# Patient Record
Sex: Male | Born: 1935 | Race: Black or African American | Hispanic: No | Marital: Married
Health system: Southern US, Community
[De-identification: ages and names within clinical notes are randomized; demographics above are authoritative.]

---

## 2005-04-14 ENCOUNTER — Inpatient Hospital Stay: Payer: Self-pay | Admitting: Internal Medicine

## 2005-04-14 ENCOUNTER — Other Ambulatory Visit: Payer: Self-pay

## 2005-04-15 ENCOUNTER — Other Ambulatory Visit: Payer: Self-pay

## 2005-09-15 ENCOUNTER — Inpatient Hospital Stay: Payer: Self-pay | Admitting: Internal Medicine

## 2005-09-15 ENCOUNTER — Other Ambulatory Visit: Payer: Self-pay

## 2008-12-01 ENCOUNTER — Ambulatory Visit: Payer: Self-pay | Admitting: Neurology

## 2011-05-13 ENCOUNTER — Inpatient Hospital Stay: Payer: Self-pay | Admitting: Student

## 2011-07-16 ENCOUNTER — Emergency Department: Payer: Self-pay | Admitting: Unknown Physician Specialty

## 2011-10-11 ENCOUNTER — Ambulatory Visit: Payer: Self-pay | Admitting: Specialist

## 2011-10-11 ENCOUNTER — Inpatient Hospital Stay: Payer: Self-pay | Admitting: Internal Medicine

## 2011-10-11 LAB — CK TOTAL AND CKMB (NOT AT ARMC)
CK, Total: 36 U/L (ref 35–232)
CK, Total: 24 U/L — ABNORMAL LOW
CK-MB: 1 ng/mL

## 2011-10-11 LAB — COMPREHENSIVE METABOLIC PANEL
BUN: 17 mg/dL (ref 7–18)
Chloride: 101 mmol/L (ref 98–107)
Co2: 32 mmol/L (ref 21–32)
Creatinine: 1.19 mg/dL (ref 0.60–1.30)
EGFR (African American): >60
EGFR (Non-African Amer.): >60
Glucose: 103 mg/dL — ABNORMAL HIGH (ref 65–99)
Potassium: 3.5 mmol/L (ref 3.5–5.1)
SGOT(AST): 12 U/L — ABNORMAL LOW (ref 15–37)
Sodium: 143 mmol/L (ref 136–145)

## 2011-10-11 LAB — URINALYSIS, COMPLETE
Bacteria: NONE SEEN
Bilirubin,UR: NEGATIVE
Glucose,UR: NEGATIVE mg/dL (ref 0–75)
Hyaline Cast: 1
Ketone: NEGATIVE
Nitrite: NEGATIVE
RBC,UR: NONE SEEN /HPF (ref 0–5)
Specific Gravity: 1.013 (ref 1.003–1.030)
Squamous Epithelial: NONE SEEN
WBC UR: <1 /HPF (ref 0–5)

## 2011-10-11 LAB — PROTIME-INR
INR: 3.2
Prothrombin Time: 33.1 secs — ABNORMAL HIGH (ref 11.5–14.7)

## 2011-10-11 LAB — APTT: Activated PTT: 55.4 secs — ABNORMAL HIGH (ref 23.6–35.9)

## 2011-10-11 LAB — TROPONIN I
Troponin-I: 0.06 ng/mL — ABNORMAL HIGH
Troponin-I: 0.07 ng/mL — ABNORMAL HIGH

## 2011-10-11 LAB — CBC WITH DIFFERENTIAL/PLATELET
Basophil %: 0.6 %
Eosinophil %: 1.3 %
HCT: 33.7 % — ABNORMAL LOW (ref 40.0–52.0)
HGB: 10.8 g/dL — ABNORMAL LOW (ref 13.0–18.0)
Lymphocyte %: 19.7 %
MCH: 26.7 pg (ref 26.0–34.0)
MCV: 83 fL (ref 80–100)
Monocyte #: 0.9 10*3/uL — ABNORMAL HIGH (ref 0.0–0.7)
Neutrophil #: 4.9 10*3/uL (ref 1.4–6.5)
Neutrophil %: 65.7 %
RBC: 4.05 10*6/uL — ABNORMAL LOW (ref 4.40–5.90)
RDW: 17.4 % — ABNORMAL HIGH (ref 11.5–14.5)

## 2011-10-11 LAB — MAGNESIUM: Magnesium: 1.5 mg/dL — ABNORMAL LOW

## 2011-10-12 LAB — CK TOTAL AND CKMB (NOT AT ARMC): CK, Total: 22 U/L — ABNORMAL LOW (ref 35–232)

## 2011-10-13 LAB — CBC WITH DIFFERENTIAL/PLATELET
Basophil #: 0.1 10*3/uL (ref 0.0–0.1)
HCT: 32.1 % — ABNORMAL LOW (ref 40.0–52.0)
Lymphocyte #: 1.7 10*3/uL (ref 1.0–3.6)
Monocyte #: 1 10*3/uL — ABNORMAL HIGH (ref 0.0–0.7)
Monocyte %: 12.3 %
Neutrophil #: 5.3 10*3/uL (ref 1.4–6.5)
Neutrophil %: 65 %
Platelet: 299 10*3/uL (ref 150–440)
RBC: 3.91 10*6/uL — ABNORMAL LOW (ref 4.40–5.90)
RDW: 17.5 % — ABNORMAL HIGH (ref 11.5–14.5)
WBC: 8.1 10*3/uL (ref 3.8–10.6)

## 2011-10-13 LAB — PROTIME-INR
INR: 1.9
Prothrombin Time: 21.7 secs — ABNORMAL HIGH (ref 11.5–14.7)

## 2011-10-14 LAB — CBC WITH DIFFERENTIAL/PLATELET
Basophil #: 0.1 10*3/uL (ref 0.0–0.1)
Basophil %: 0.6 %
Eosinophil #: 0.2 10*3/uL (ref 0.0–0.7)
Eosinophil %: 1.9 %
HCT: 34.6 % — ABNORMAL LOW (ref 40.0–52.0)
HGB: 10.9 g/dL — ABNORMAL LOW (ref 13.0–18.0)
Lymphocyte #: 2 10*3/uL (ref 1.0–3.6)
MCHC: 31.5 g/dL — ABNORMAL LOW (ref 32.0–36.0)
MCV: 84 fL (ref 80–100)
Monocyte %: 11.5 %
Neutrophil #: 5.6 10*3/uL (ref 1.4–6.5)
Neutrophil %: 63.3 %
RBC: 4.13 10*6/uL — ABNORMAL LOW (ref 4.40–5.90)
RDW: 17.2 % — ABNORMAL HIGH (ref 11.5–14.5)

## 2011-10-14 LAB — BASIC METABOLIC PANEL
Anion Gap: 9 (ref 7–16)
BUN: 18 mg/dL (ref 7–18)
Calcium, Total: 8.8 mg/dL (ref 8.5–10.1)
Co2: 29 mmol/L (ref 21–32)
Creatinine: 1.05 mg/dL (ref 0.60–1.30)
EGFR (Non-African Amer.): >60
Potassium: 4.4 mmol/L (ref 3.5–5.1)
Sodium: 142 mmol/L (ref 136–145)

## 2011-10-14 LAB — PROTIME-INR
INR: 1.5
Prothrombin Time: 18.5 secs — ABNORMAL HIGH (ref 11.5–14.7)

## 2011-10-14 LAB — BODY FLUID CELL COUNT WITH DIFFERENTIAL
Basophil: 0 %
Eosinophil: 0 %

## 2011-10-14 LAB — GLUCOSE, SEROUS FLUID

## 2011-10-14 LAB — LACTATE DEHYDROGENASE, PLEURAL OR PERITONEAL FLUID: LDH, Body Fluid: 62 U/L

## 2011-10-14 LAB — APTT: Activated PTT: 81.5 secs — ABNORMAL HIGH (ref 23.6–35.9)

## 2011-10-15 LAB — APTT: Activated PTT: 84.2 secs — ABNORMAL HIGH (ref 23.6–35.9)

## 2011-12-18 DEATH — deceased

## 2013-03-16 IMAGING — US US EXTREM LOW VENOUS BILAT
1 series · 17 of 24 positions shown · non-contrast
Comparison: none

REASON FOR EXAM: swelling, pain
COMMENTS:

PROCEDURE:     US  - US DOPPLER LOW EXTR BILATERAL  - October 11, 2011  [DATE]
RESULT:     Comparison: None

[Series 1: us extrem low venous bilat · 17 of 43 slices shown]
[im 1/43]
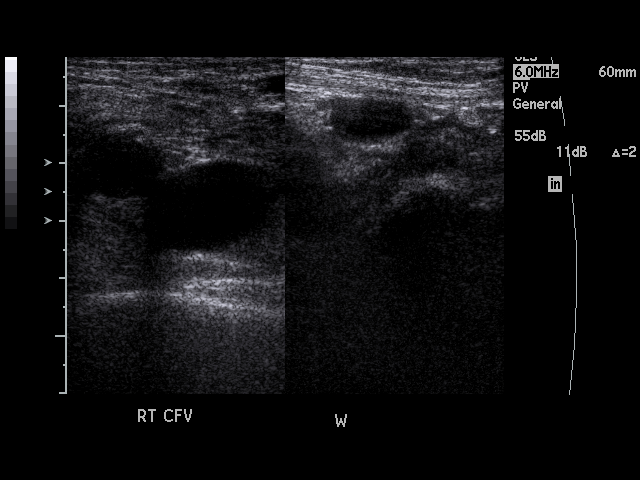
[im 4/43]
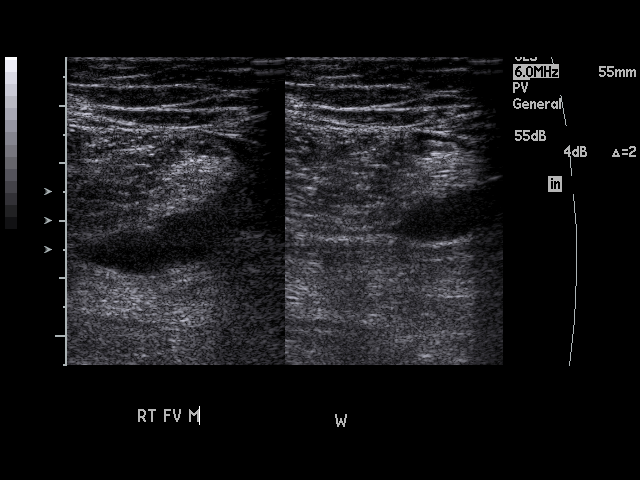
[im 6/43]
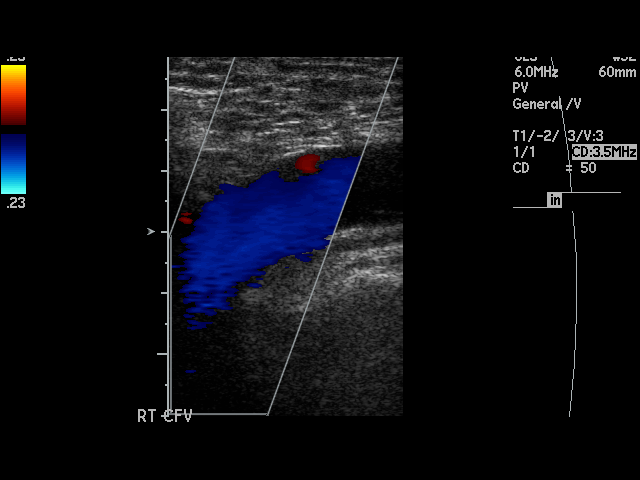
[im 8/43]
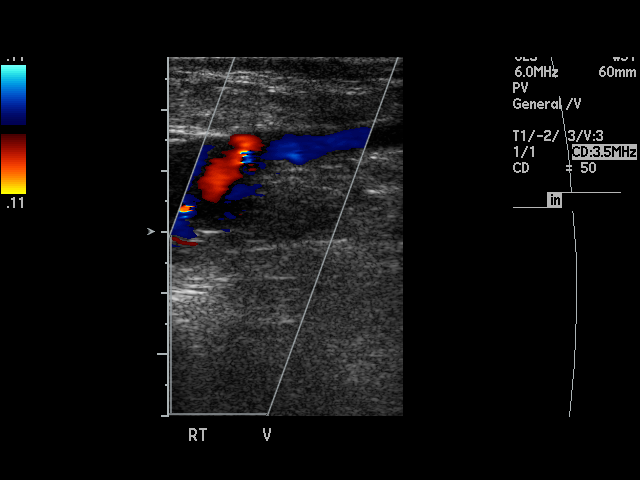
[im 11/43]
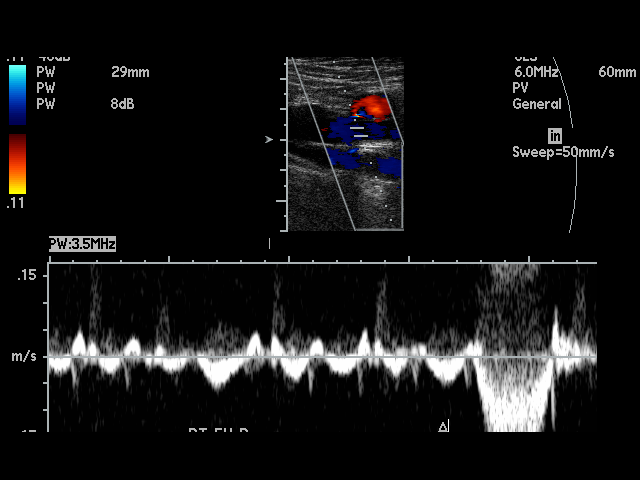
[im 13/43]
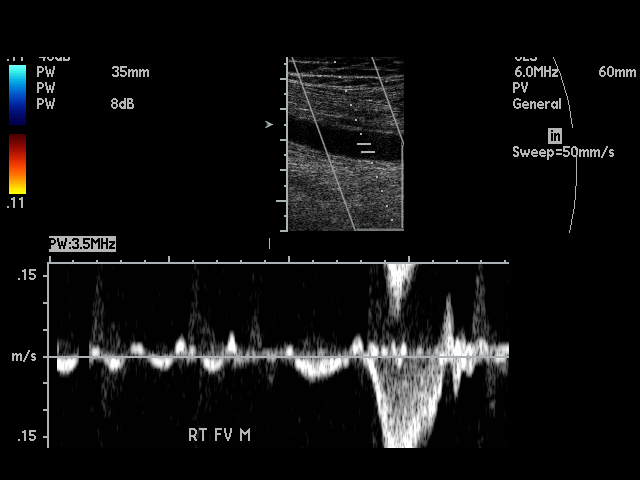
[im 17/43]
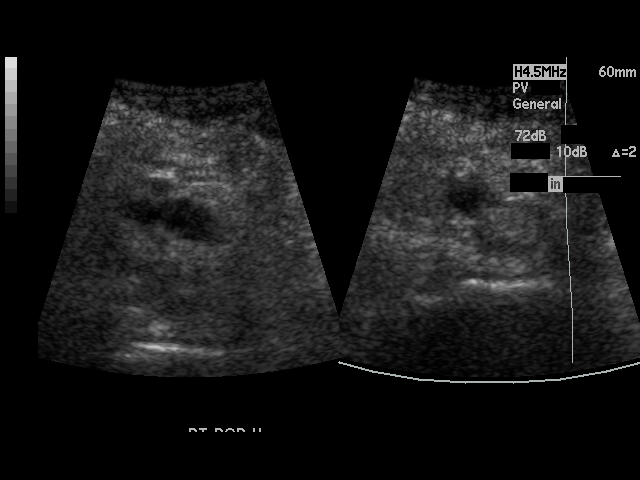
[im 19/43]
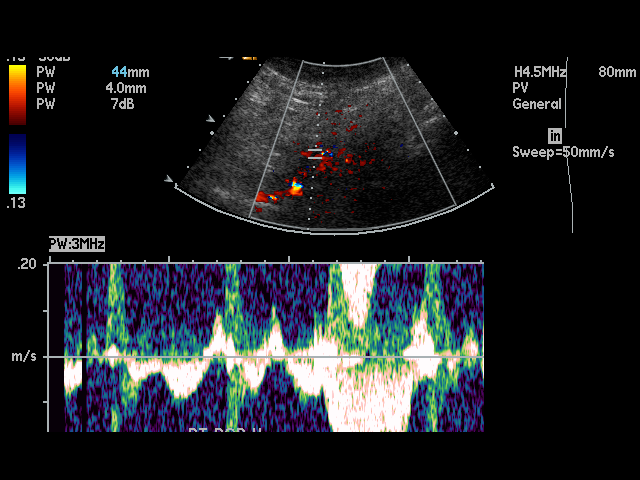
[im 22/43]
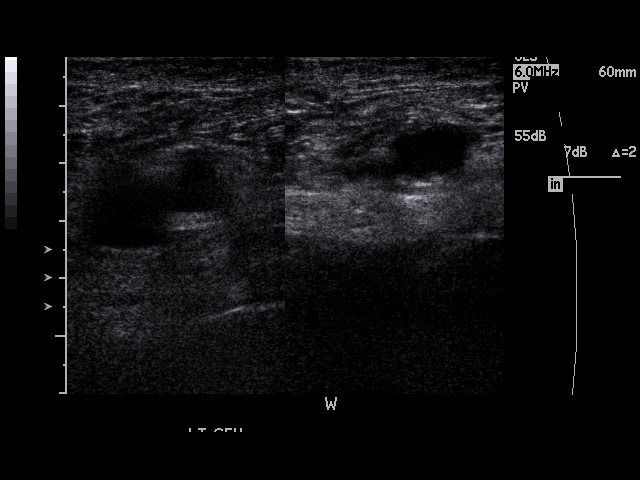
[im 24/43]
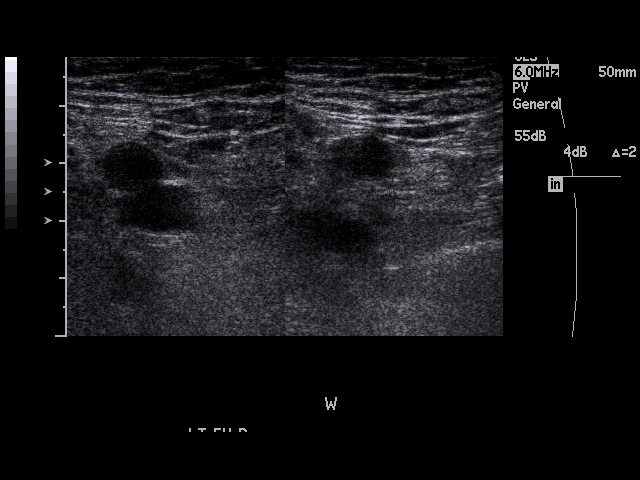
[im 26/43]
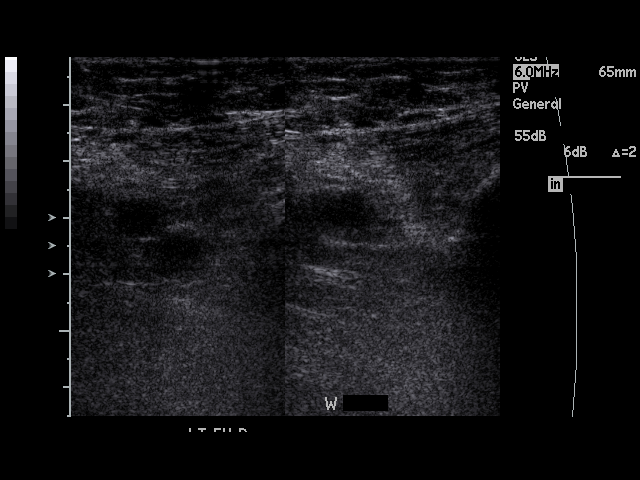
[im 30/43]
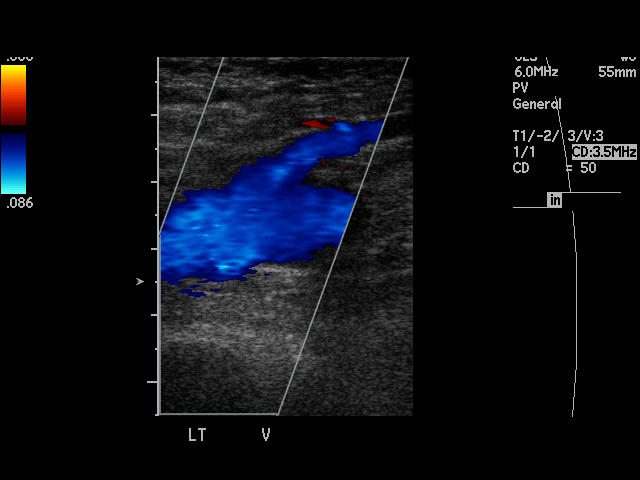
[im 32/43]
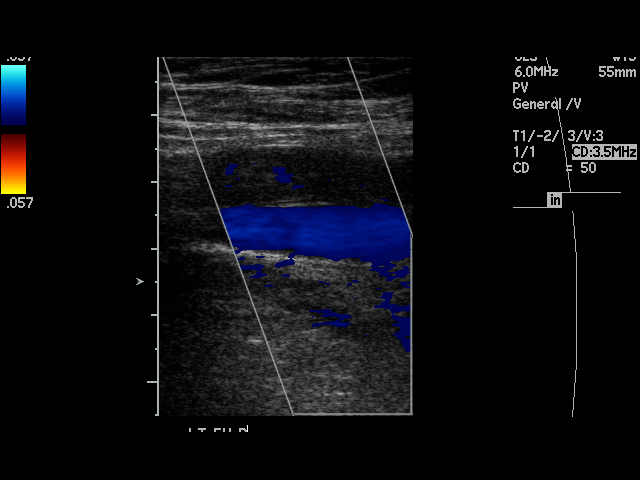
[im 35/43]
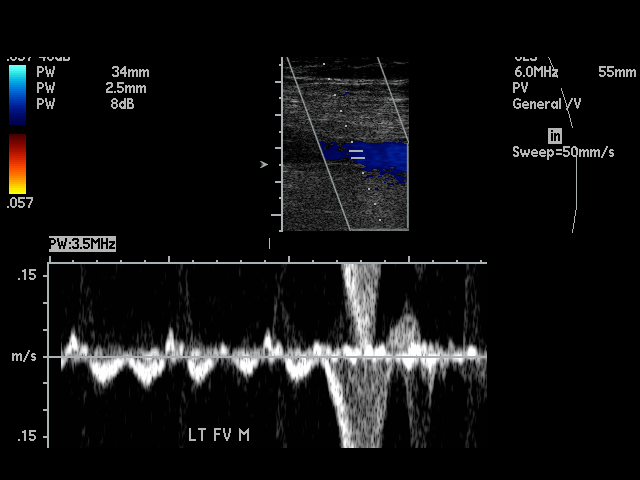
[im 37/43]
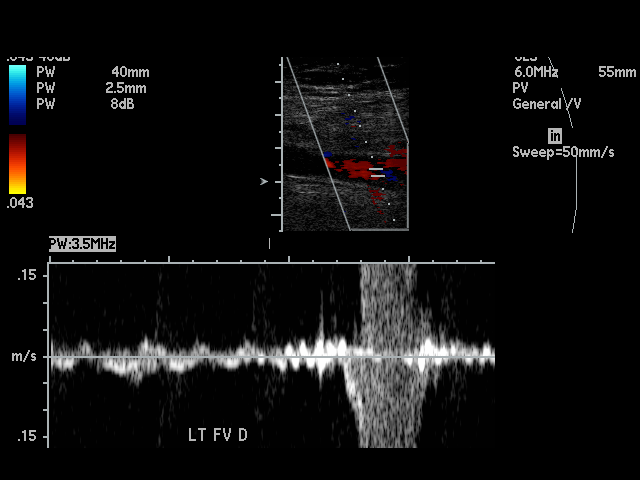
[im 39/43]
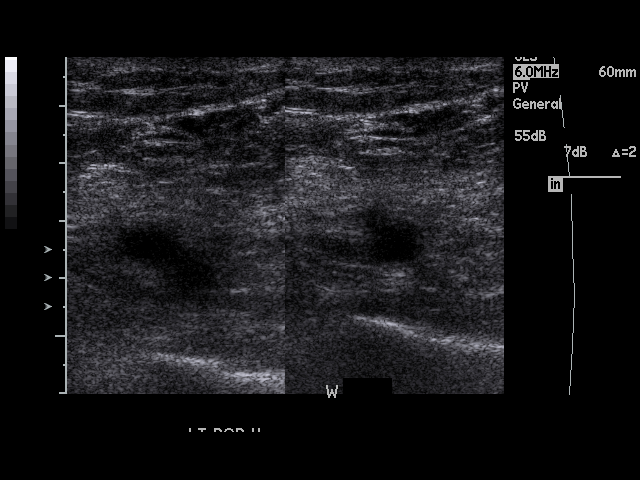
[im 43/43]
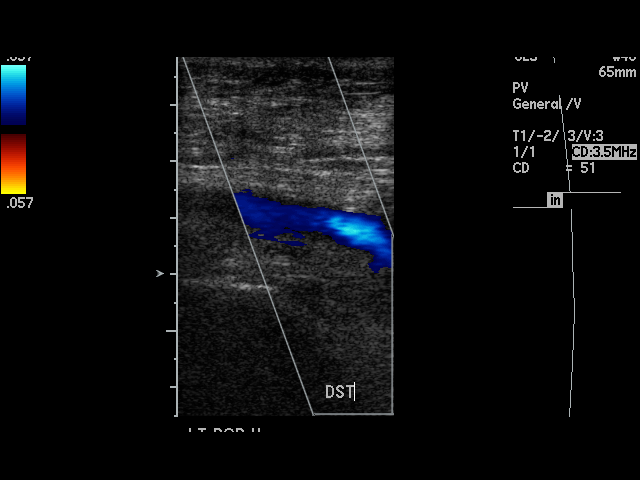

[17 of 24 positions shown; findings below may reference images not displayed]

FINDINGS: Multiple longitudinal and transverse gray-scale as well as color
and spectral Doppler images of  bilateral lower extremity veins were
obtained from the common femoral veins through the popliteal veins.

The right common femoral, greater saphenous, femoral, popliteal veins, and
venous trifurcation are patent, demonstrating normal color-flow and
compressibility. No intraluminal thrombus is identified.There is normal
respiratory variation and augmentation demonstrated at all vein levels.

The left common femoral, greater saphenous, femoral, popliteal veins, and
venous trifurcation are patent, demonstrating normal color-flow and
compressibility. No intraluminal thrombus is identified.There is normal
respiratory variation and augmentation demonstrated at all vein levels.
IMPRESSION: 1. No evidence of DVT in the right lower extremity.
2. No evidence of DVT in the left lower extremity.

## 2013-03-16 IMAGING — CT CT CHEST W/ CM
1 of 2 series · 16 of 31 positions shown, 20 images · IV contrast (agent unspecified)
Comparison: none

REASON FOR EXAM: COMMENTS:

PROCEDURE:     CT  - CT CHEST WITH CONTRAST  - October 11, 2011 [DATE]
RESULT:      History: Shortness of breath.
Comparison Study: Prior CT of 05/13/2011.

[Series 2: soft tissue · axial · 0.73mm/px · z∈[-649,-374]mm · 16 of 61 slices shown, 20 images]
[im 3/61  mediastinal]
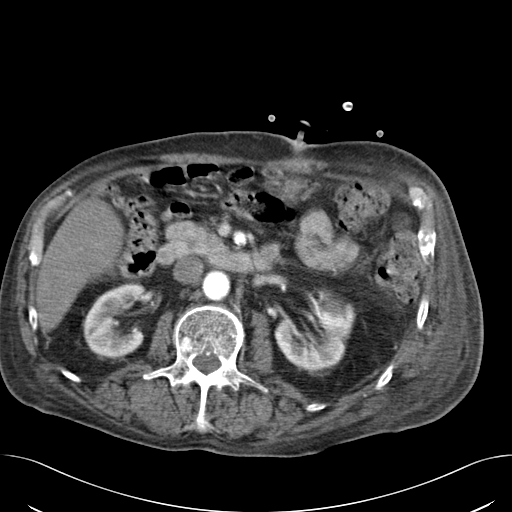
[im 3/61  lung]
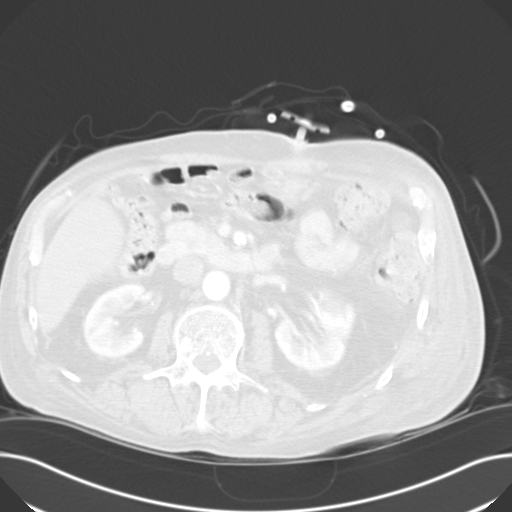
[im 9/61  lung]
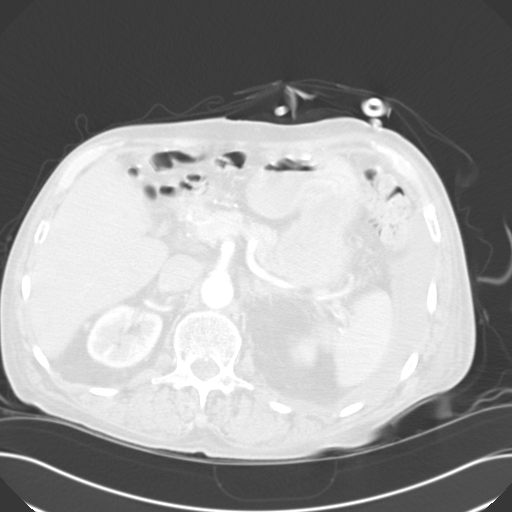
[im 11/61  lung]
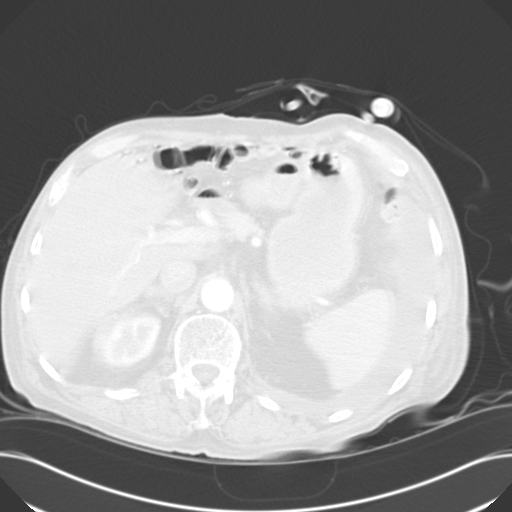
[im 16/61  lung]
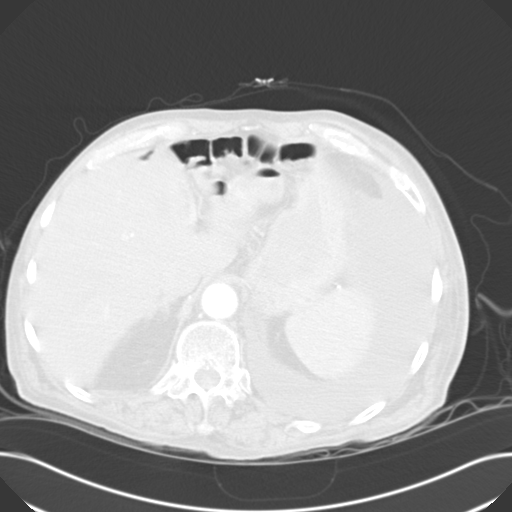
[im 17/61  mediastinal]
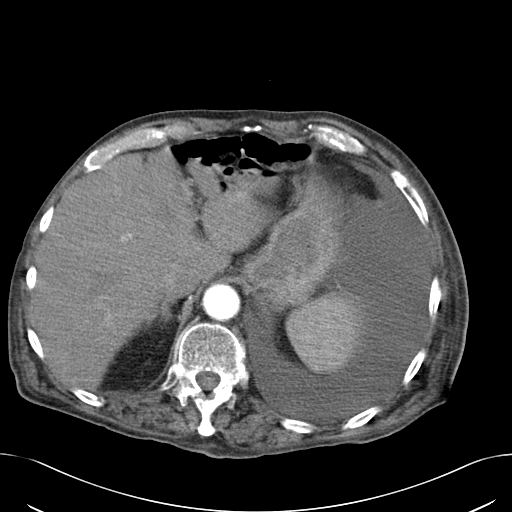
[im 17/61  lung]
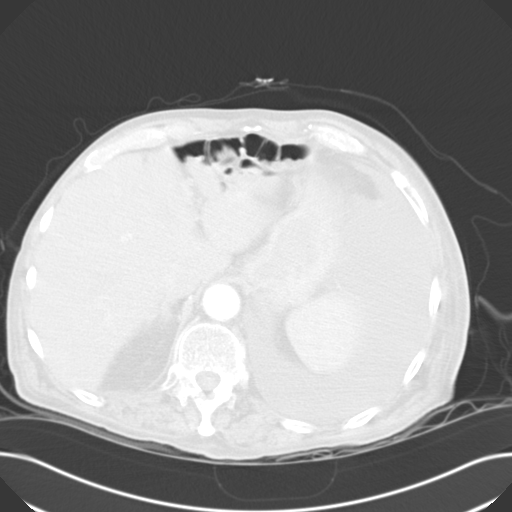
[im 22/61  lung]
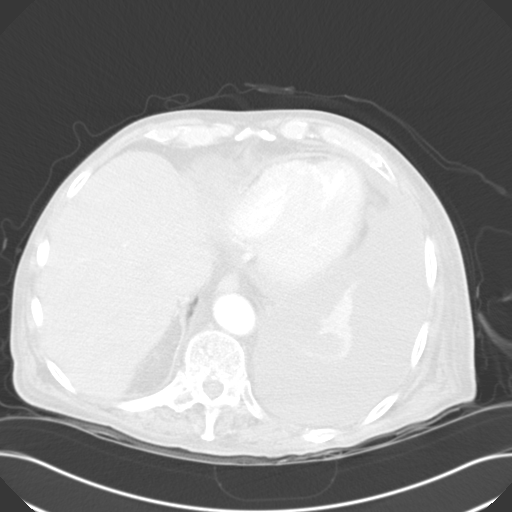
[im 25/61  lung]
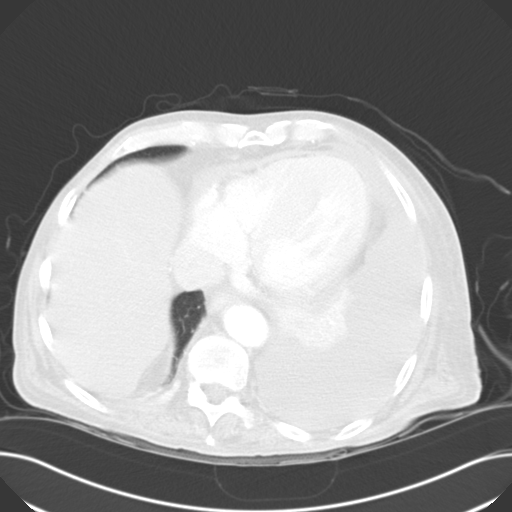
[im 30/61  lung]
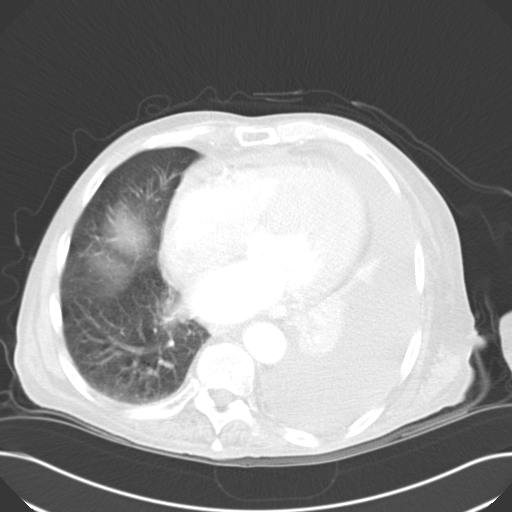
[im 31/61  mediastinal]
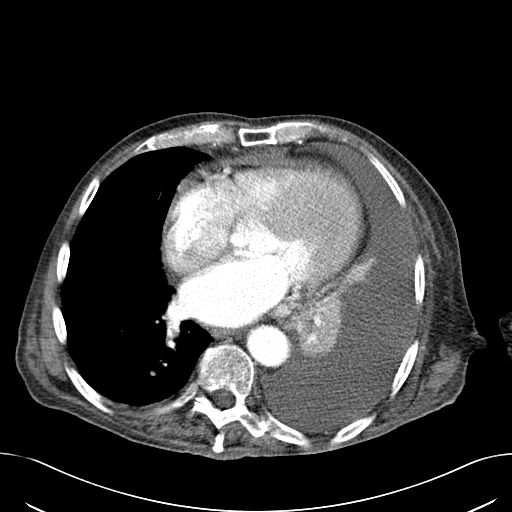
[im 31/61  lung]
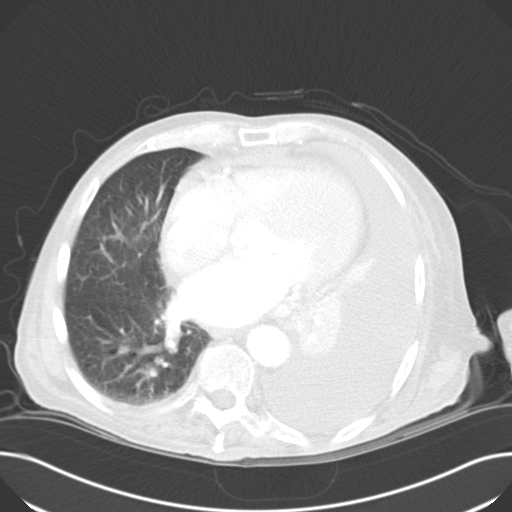
[im 36/61  lung]
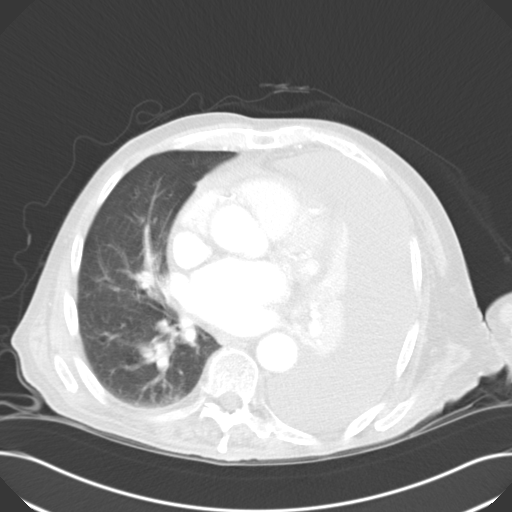
[im 39/61  lung]
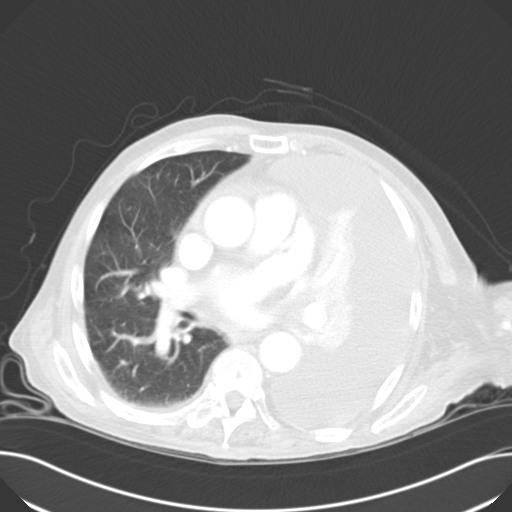
[im 44/61  lung]
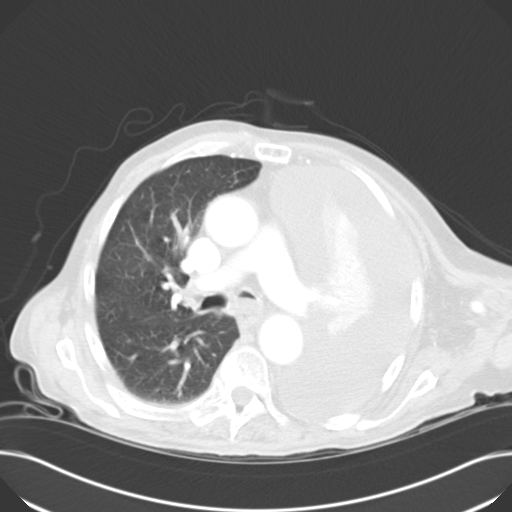
[im 46/61  mediastinal]
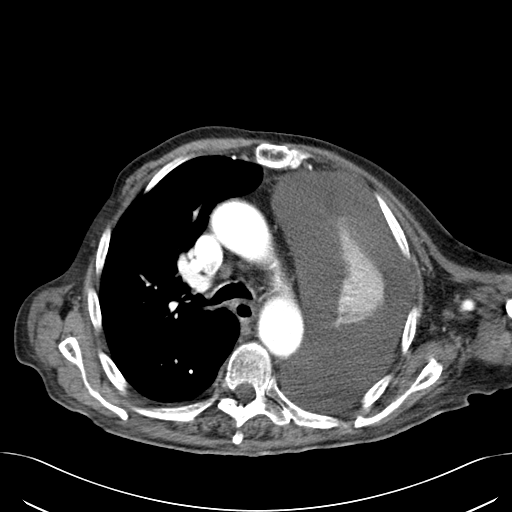
[im 46/61  lung]
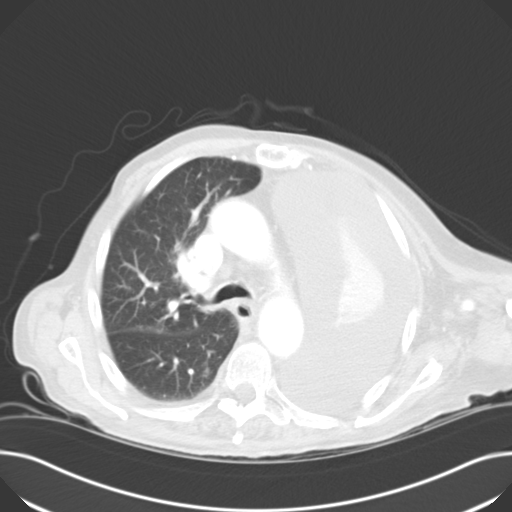
[im 50/61  lung]
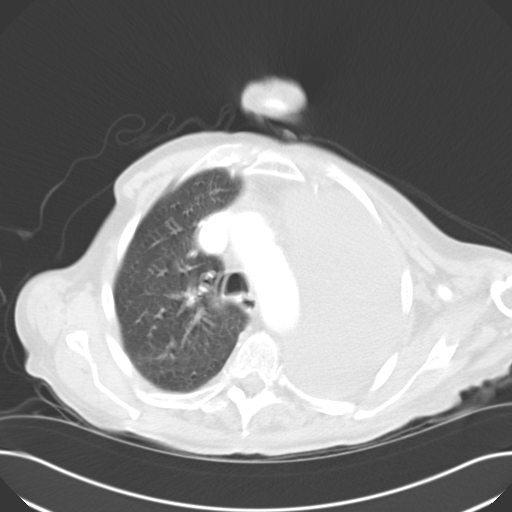
[im 52/61  lung]
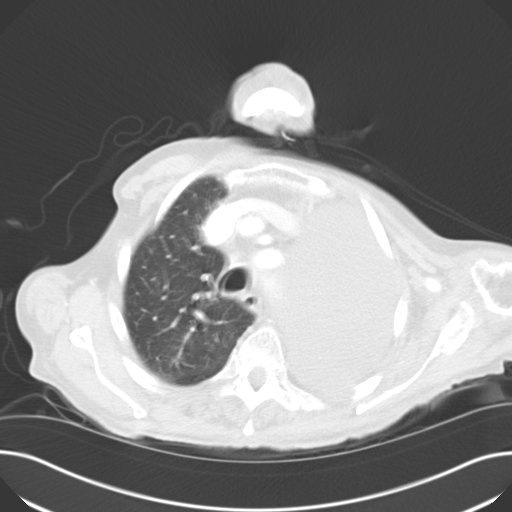
[im 58/61  lung]
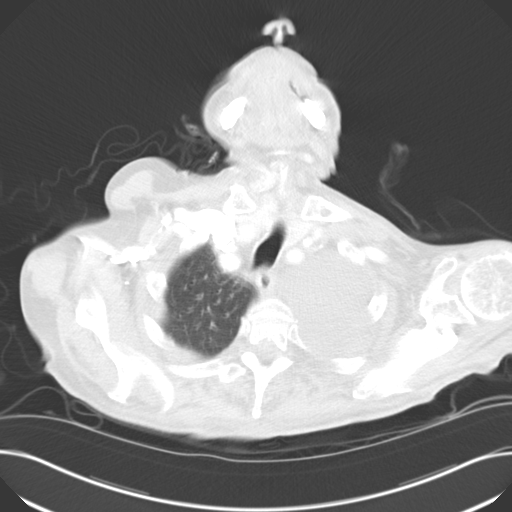

[16 of 31 positions shown; findings below may reference images not displayed]

FINDINGS: 75 cc of [id number] administered. Large left pleural effusion is
present occupying the chest left atelectasis at the left lung is present.
Small pulmonary emboli are noted on the right. Cardiomegaly is present with
small pericardial effusion. Coronary artery disease present. Right lung is
clear of infiltrates. Mild atelectasis right lung base. Gastrostomy tube
noted in good anatomic position. Adrenals unremarkable.
IMPRESSION: 1. Small pulmonary emboli on the right.
2. Opacification of left hemithorax with large left pleural effusion. There
is left lung atelectasis.

Report phoned to patient's physician at [DATE] p.m. on 10/11/2011.

Addendum: Fluid and/or soft tissue densities noted in the left mainstem
bronchus.

## 2015-01-10 NOTE — H&P (Signed)
PATIENT NAME:  Jesus Dudley, Jesus Dudley MR#:  [id number] DATE OF BIRTH:  [birth year]  DATE OF ADMISSION:  10/11/2011  PRIMARY CARE PHYSICIAN: Leim Fabry, MD    PULMONOLOGIST: Ned Clines, MD    CARDIOLOGIST: Harold Hedge, MD    PRIMARY CARE PHYSICIAN: As a direct admission shortness of breath.   REFERRING PHYSICIAN: Referred by Dr. Meredeth Ide as a direct admission for shortness of breath.   CHIEF COMPLAINT: Shortness of breath.  HISTORY OF PRESENT ILLNESS: The patient is an 81 year old male with a history of hypertension, left ventricular hypertrophy, chronic obstructive pulmonary disease, history of supraventricular tachycardia, glucose intolerance, admission to [facility] in August of 2012 for NSTEMI and acute stroke with left-sided weakness. The patient last saw Dr. Lady Gary about a week ago and had an echo done in his office. He saw Dr. Dayna Barker yesterday, who referred him to Dr. Meredeth Ide. The patient has been having shortness of breath, which has been progressively getting worse, associated with leg pain and left-sided chest pain; therefore, he went to see Dr. Meredeth Ide today, who ordered a CAT scan of the chest. The CAT scan of the chest showed a large left pleural effusion pressing on the left lung in association with a small PE. Therefore, Dr. Meredeth Ide sent him to the Hospitalist Service as a direct admission. The patient had a stroke in August of 2012 and since then has been debilitated. He has gone back to eating oral food, although he still has gastrostomy tube. He has residual left-sided hemiparesis and ambulates with a walker.   ALLERGIES: No known drug allergies.   PAST MEDICAL HISTORY:  1. Chronic obstructive pulmonary disease.  2. Hypertension.  3. Hyperlipidemia.  4. Gastroesophageal reflux disease.  5. Hypothyroidism possibly due to thyroiditis.  6. Left hip pain.  7. Cerebrovascular accident.  8. Hypertensive cardiomyopathy. The patient's last echo is from August of 2012 which showed  normal ejection fraction, moderate left ventricular hypertrophy, elevated right ventricular systolic pressure of 45 to 50, small pericardial effusion, and moderate anterior pericardial effusion.  9. History of cardiac arrhythmia/supraventricular tachycardia.  10. History of acute on chronic kidney failure with chronic kidney disease, stage III.  11. Non ST-elevation myocardial infarction.  12. History of chronic pericardial effusion.  13. History of smoking in the past.   CURRENT MEDICATIONS:  1. Ventolin metered dose inhaler, 2 puffs every 4 hours p.r.n.  2. Aspirin 81 mg daily.  3. Lipitor 20 mg daily.  4. Prilosec 20 mg daily.  5. Advair 250/50, 1 puff b.i.d.  6. Lasix 20 mg daily.  7. Tapazole 5 mg b.i.d.  8. Lopressor 35 mg b.i.d.  9. Remeron 50 mg at bedtime.  10. Nitroglycerin p.r.n.  11. Omeprazole 20 mg daily.  12. MiraLax 17 grams daily. 13. Senokot 8.6 mg b.i.d. p.r.n.  14. Spiriva 18 mcg inhaled daily.  15. Tramadol 50 mg every 6 hours p.r.n.  16. Trazodone 50 mg at bedtime.  17. Verapamil 180 mg daily.  18. Coumadin 5 mg daily.  19. KCl 20 mEq b.i.d.   SOCIAL HISTORY: The patient quit smoking. He used to smoke 1 pack per day. He is currently living with his granddaughter and grandson. There is no history of alcohol or drug abuse. He ambulates with a walker after his stroke.   FAMILY HISTORY: Positive for diabetes, hypertension, and coronary artery disease.  REVIEW OF SYSTEMS:  CONSTITUTIONAL: The patient denies any fever. Reports weakness and fatigue. EYES: Denies any blurred or double vision. ENT: Denies any tinnitus,  ear pain. CARDIOVASCULAR: Reports left-sided chest pain. Denies any tachycardia or palpitations. RESPIRATORY: Reports shortness of breath. Denies any wheezing or cough. GI: Denies any nausea, vomiting, diarrhea, or abdominal pain. GU: Denies any nocturia or polyuria.  MUSCULOSKELETAL: Denies any joint pain or stiffness. INTEGUMENTARY: Denies any acne  eruptions. NEUROLOGICAL: Has left-sided hemiparesis from a stroke in August of 2012. Denies any blackouts or fainting spells. PSYCHIATRIC:  Reports insomnia. Denies any headache. ENDOCRINE: Has hypothyroidism. Denies any heat or cold intolerance. HEME/LYMPH: Denies any anemia or easy bruisability.   PHYSICAL EXAMINATION:  VITAL SIGNS: Temperature 97.5, heart rate 72, respiratory rate 20, blood pressure 148/91, pulse oximetry 97% on room air.   GENERAL: The patient is an elderly [place] male, chronically ill-appearing, lying comfortably in bed, not in acute distress.   HEENT: Head: Atraumatic, normocephalic. EYES: There is pallor, icterus, and cyanosis. Pupils equal, round, and reactive to light and accommodation. Extraocular movements are intact. ENT: Wet mucous membranes. No oropharyngeal erythema or thrush.   NECK: Supple. No masses. No JVD or thyromegaly. No lymphadenopathy.   CHEST WALL: No tenderness to palpation. Not using accessory muscles of respiration. No intercostal muscle retractions.   LUNGS: The patient has reduced breath sounds on the left side with a few basal crepitations.   CARDIOVASCULAR: S1, S2 regular.  There is a systolic murmur. No rubs or gallops.   ABDOMEN: Soft, nontender, nondistended. No guarding, no rigidity, no organomegaly. Normal bowel sounds. The patient has a G-tube.   SKIN: The patient has lower extremity hyperpigmentation with dry flaky skin. No other rashes or lesions.  PERIPHERIES: The patient has pedal edema, 1+ pedal pulses. He has tenderness to palpation on his lower extremities all the way up to his knees. He has calf swelling.   MUSCULOSKELETAL: No cyanosis or clubbing.   NEUROLOGICAL: The patient is awake and alert. He follows commands. His cranial nerves are grossly intact. He has left-sided hemiparesis.   PSYCHIATRIC: Normal mood and affect.   LABORATORY, DIAGNOSTIC AND RADIOLOGICAL DATA:  White count 7.5, hemoglobin 10.8, hematocrit  33.7, platelets 334.   Glucose 103, BUN 17, creatinine 1.19, sodium 143, potassium 3.5, chloride 101, CO2 32, calcium 9.1. Normal LFTs.  Magnesium 1.5.  INR 33.2, PTT 55.4.  CT chest shows left-sided pleural effusion with PE.   ASSESSMENT AND PLAN: An 81 year old male with past medical history of cerebrovascular accident, cardiac arrhythmias, hypertensive cardiomyopathy and chronic obstructive pulmonary disease, sent as a direct admit from Dr. Reita Cliche office. He presented there with progressive shortness of breath, had a CAT scan which showed left PE and large pleural effusion.   1. Pulmonary embolus: There a question whether it is old since the patient is on Coumadin and his INR is therapeutic. We will also check lower extremity Dopplers to rule out deep vein thrombosis since the patient has pain and swelling. If he has DVT and got a PE while being on Coumadin, he may need an IVC filter.  2. Large left pleural effusion: We will hold the patient's Coumadin since he is going to require thoracentesis. We will order thoracentesis with pleural fluid studies. We will obtain a Pulmonary consultation and echocardiogram.  3. Hypertension: Appears to be stable at present. We will continue the patient's Lopressor and Verapamil.  4. Chronic obstructive pulmonary disease: There is no evidence of wheezing at present. We will continue the patient's Advair and Ventolin.  5. History of cerebrovascular accident: We will continue aspirin and statin.  6. Possible depression: We will  continue Prozac, Remeron, and trazodone.  7. History of hyperthyroidism: We will continue Tapazole.  8. Chronic kidney disease, stage III: The patient's creatinine has essentially normalized.  9. Anemia, likely of chronic disease: Appears to be stable at present.  10. Hyperglycemia: Possibly reactive as the patient has no history of diabetes.  11. Hypomagnesemia: We will replace.  12. Gastroesophageal reflux disease: We will continue  protein pump inhibitor. I reviewed all medical records, discussed with Dr. Meredeth IdeFleming, discussed with the patient and his daughter the plan of care and management.   TIME SPENT: 75 minutes.   ____________________________ Darrick MeigsSangeeta Warnell Rasnic, MD sp:cbb D: 10/11/2011 16:30:41 ET T: 10/11/2011 17:39:48 ET JOB#: [id number]  cc: Darrick MeigsSangeeta Jazmaine Fuelling, MD, <Dictator> Katina DungBarbara D. Dayna BarkerAldridge, MD Darrick MeigsSANGEETA Jerrika Ledlow MD ELECTRONICALLY SIGNED 10/13/2011 20:42

## 2015-01-10 NOTE — Discharge Summary (Signed)
PATIENT NAME:  Jesus Dudley, Jesus Dudley MR#:  [id number] DATE OF BIRTH:  03-Jul-1929  DATE OF ADMISSION:  10/11/2011 DATE OF DISCHARGE:  10/15/2011  DIAGNOSES:  1. Pulmonary embolism. 2. Large left pleural effusion status post thoracentesis. 3. Chronic pericardial effusion on echo. 4. Hypertension. 5. Chronic obstructive pulmonary disease. 6. History of cerebrovascular accident.  7. Possible depression. 8. Hypothyroidism. 9. Anemia of chronic disease.   DISPOSITION: Patient is being discharged home with home health and physical therapy.  FOLLOW UP: Follow up with primary care physician, Dr. Dayna BarkerAldridge, in 1 to 2 weeks after discharge. Follow up with Dr. Meredeth IdeFleming in 1 to 2 weeks after discharge.    DIET: Low sodium, mechanical soft.   DISCHARGE MEDICATIONS:  1. Tylenol 650 mg every six hours p.r.n.  2. Combivent 2 puffs q.4 hours p.r.n. wheezing.  3. Aspirin 81 mg daily.  4. Lipitor 20 mg daily.  5. Senokot b.i.d. p.r.n. constipation. 6. Prozac 20 mg daily.  7. Advair 250/50, 1 puff b.i.d.  8. Lasix 20 mg daily.  9. Tapazole 5 mg b.i.d.  10. Lopressor 75 mg b.i.d.  11. Remeron 15 mg at bedtime.  12. Omeprazole 20 mg daily.  13. MiraLax 17 mg daily p.r.n. for constipation.  14. KCl 20 mEq daily.  15. Spiriva 18 mcg inhaled daily.  16. Tramadol 50 mg every six hours p.r.n.  17. Trazodone 50 mg at bedtime.  18. Verapamil ER 180 mg daily.  19. Coumadin 5 mg daily  PROCEDURE DONE: Left-sided thoracentesis.  LABORATORY, DIAGNOSTIC AND RADIOLOGICAL DATA: CT of the chest showed small pulmonary embolus on the right and large left pleural effusion. Echo ejection fraction 35% to 45%. Left ventricular systolic function is moderately reduced. The study is technically limited. The right ventricle is grossly normal in size. The right atrium is borderline dilated. Mild MR. trace TR. White count normal, hemoglobin ranging from 10.8 to 10.9, platelets normal. Complete metabolic panel essentially normal.  Cardiac enzymes ranged from 0.06 to 0.07.   HOSPITAL COURSE: Patient is an 81 year old male with history of cerebrovascular accident, depression, hypertension, chronic obstructive pulmonary disease who was sent as a direct admit from Dr. Reita ClicheFleming's office for progressive shortness of breath. Dr. Meredeth IdeFleming had ordered a CAT scan of the chest which had shown pulmonary embolism and large left pleural effusion. It was unclear whether the patient's pulmonary embolism was old or new since patient was on Coumadin and his INR was therapeutic. Patient had lower extremity Doppler's which were negative for any deep vein thrombosis. Since patient was on Coumadin and his INR was therapeutic he could not have thoracentesis of his left pleural effusion done initially. Patient's Coumadin was held and he was on heparin drip until his INR level fell to 1.9 following which patient underwent paracentesis successfully. Testing on the pleural fluid has been ordered and is pending. Patient's other medical problems including hypertension, chronic obstructive pulmonary disease remained stable during the hospitalization. He is being discharged home in a stable condition. He will follow up with Dr. Meredeth IdeFleming in 1 to 2 weeks after discharge. Hopefully the results of his thoracentesis will be available at that time. Patient was evaluated by physical therapy in the hospital and home health was recommended. Patient's family was agreeable. Home health and physical therapy will be arranged for the patient.   TIME SPENT: 45 minutes.  ____________________________ Darrick MeigsSangeeta Sheriece Jefcoat, MD sp:cms D: 10/14/2011 15:15:01 ET T: 10/16/2011 09:12:29 ET  JOB#: [id number] cc: Darrick MeigsSangeeta Evalina Tabak, MD, <Dictator> Katina DungBarbara D. Dayna BarkerAldridge, MD Lum BabeSANGEETA  [facility] MD ELECTRONICALLY SIGNED 10/16/2011 15:10
